# Patient Record
Sex: Male | Born: 1990 | Race: Black or African American | Hispanic: No | Marital: Single | Smoking: Current every day smoker
Health system: Southern US, Community
[De-identification: ages and names within clinical notes are randomized; demographics above are authoritative.]

## PROBLEM LIST (undated history)

## (undated) ENCOUNTER — Ambulatory Visit (HOSPITAL_COMMUNITY): Admission: EM

## (undated) DIAGNOSIS — B20 Human immunodeficiency virus [HIV] disease: Secondary | ICD-10-CM

---
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
## 2022-08-08 ENCOUNTER — Other Ambulatory Visit (HOSPITAL_COMMUNITY): Payer: Self-pay

## 2022-08-08 ENCOUNTER — Encounter (HOSPITAL_COMMUNITY): Payer: Self-pay

## 2022-08-08 ENCOUNTER — Emergency Department (HOSPITAL_COMMUNITY)
Admission: EM | Admit: 2022-08-08 | Discharge: 2022-08-08 | Disposition: A | Discharge status: Home / Self Care | Payer: 59 | Attending: Emergency Medicine | Admitting: Emergency Medicine

## 2022-08-08 DIAGNOSIS — R000 Tachycardia, unspecified: Secondary | ICD-10-CM | POA: Insufficient documentation

## 2022-08-08 DIAGNOSIS — Z21 Asymptomatic human immunodeficiency virus [HIV] infection status: Secondary | ICD-10-CM | POA: Diagnosis not present

## 2022-08-08 DIAGNOSIS — Z202 Contact with and (suspected) exposure to infections with a predominantly sexual mode of transmission: Secondary | ICD-10-CM | POA: Diagnosis present

## 2022-08-08 HISTORY — DX: Human immunodeficiency virus (HIV) disease: B20

## 2022-08-08 LAB — URINALYSIS, ROUTINE W REFLEX MICROSCOPIC
Glucose, UA: NEGATIVE mg/dL
Hgb urine dipstick: NEGATIVE
Ketones, ur: NEGATIVE mg/dL
Leukocytes,Ua: NEGATIVE
Nitrite: NEGATIVE
Protein, ur: 30 mg/dL — AB
Specific Gravity, Urine: 1.035 — ABNORMAL HIGH (ref 1.005–1.030)
pH: 5 (ref 5.0–8.0)

## 2022-08-08 MED ORDER — LIDOCAINE HCL (PF) 1 % IJ SOLN
INTRAMUSCULAR | Status: AC
  Administered 2022-08-08: 1.05 mL
  Filled 2022-08-08: qty 30

## 2022-08-08 MED ORDER — CEFTRIAXONE SODIUM 1 G IJ SOLR
500.0000 mg | Freq: Once | INTRAMUSCULAR | Status: AC
  Administered 2022-08-08: 500 mg via INTRAMUSCULAR
  Filled 2022-08-08: qty 10

## 2022-08-08 MED ORDER — DOXYCYCLINE HYCLATE 100 MG PO CAPS
100.0000 mg | ORAL_CAPSULE | Freq: Two times a day (BID) | ORAL | 0 refills | Status: AC
  Filled 2022-08-08: qty 20, 10d supply, fill #0

## 2022-08-08 NOTE — ED Triage Notes (Signed)
Pt presents with c/o possible exposure to STD. Pt reports unprotected sex approx 2 days ago. Pt reports he is now having some lower abdominal pressure as well as milky white discharge from his rectum and some blood from his rectum as well.

## 2022-08-08 NOTE — Discharge Instructions (Signed)
Please take antibiotic prescribed as treatment for suspected sexual transmitted infection.  Always use protection, follow-up on the test result through MyChart, link below.  Return if you have any concern.

## 2022-08-08 NOTE — ED Provider Notes (Signed)
[place] EMERGENCY DEPARTMENT AT [facility] Provider Note   CSN: [id number] Arrival date & time: 08/08/22  1035     History  No chief complaint on file.   Eric Dean is a 32 y.o. male.  The history is provided by the patient and medical records. No language interpreter was used.     32 year old male seen in history of HIV presenting with concerns for potential STD.  Patient report for the past 2 days he has noticed some rectal discomfort as well is having mucousy discharge coming from his rectum with some blood.  He endorsed feeling a bit fatigued, and just not feeling well.  Does not endorse any fever chills nausea vomiting diarrhea no dysuria.  He admits to having sex partners.  He is unsure of his last viral load but states that it was undetectable in April.  He is unsure of his CD4 count.  He does have an ID specialist and have been taking Biktarvy regularly.  He mentioned he has had similar rectal discomfort secondary to STI and would like to be tested.  Denies any penile rash, penile pain, or penile discharge.  Home Medications Prior to Admission medications   Not on File      Allergies    Patient has no allergy information on record.    Review of Systems   Review of Systems  All other systems reviewed and are negative.   Physical Exam Updated Vital Signs BP 125/83 (BP Location: Right Arm)   Pulse (!) 111   Temp 98.2 F (36.8 C) (Oral)   Resp 16   SpO2 99%  Physical Exam Vitals and nursing note reviewed.  Constitutional:      General: He is not in acute distress.    Appearance: He is well-developed.  HENT:     Head: Atraumatic.  Eyes:     Conjunctiva/sclera: Conjunctivae normal.  Cardiovascular:     Rate and Rhythm: Tachycardia present.     Pulses: Normal pulses.     Heart sounds: Normal heart sounds.  Pulmonary:     Effort: Pulmonary effort is normal.     Breath sounds: Normal breath sounds.  Abdominal:     Palpations: Abdomen is soft.      Tenderness: There is no abdominal tenderness.  Musculoskeletal:     Cervical back: Neck supple.  Skin:    Findings: No rash.  Neurological:     Mental Status: He is alert.     ED Results / Procedures / Treatments   Labs (all labs ordered are listed, but only abnormal results are displayed) Labs Reviewed  URINALYSIS, ROUTINE W REFLEX MICROSCOPIC - Abnormal; Notable for the following components:      Result Value   Color, Urine AMBER (*)    Specific Gravity, Urine 1.035 (*)    Bilirubin Urine SMALL (*)    Protein, ur 30 (*)    Bacteria, UA RARE (*)    All other components within normal limits  RPR  GC/CHLAMYDIA PROBE AMP ([place]) NOT AT [facility]    EKG None  Radiology No results found.  Procedures Procedures    Medications Ordered in ED Medications  cefTRIAXone (ROCEPHIN) injection 500 mg (500 mg Intramuscular Given 08/08/22 1139)  lidocaine (PF) (XYLOCAINE) 1 % injection (1.05 mLs  Given 08/08/22 1139)    ED Course/ Medical Decision Making/ A&P  Medical Decision Making  BP 125/83 (BP Location: Right Arm)   Pulse (!) 111   Temp 98.2 F (36.8 C) (Oral)   Resp 16   SpO2 99%   10:76 AM  32 year old male seen in history of HIV presenting with concerns for potential STD.  Patient report for the past 2 days he has noticed some rectal discomfort as well is having mucousy discharge coming from his rectum with some blood.  He endorsed feeling a bit fatigued, and just not feeling well.  Does not endorse any fever chills nausea vomiting diarrhea no dysuria.  He admits to having sex partners.  He is unsure of his last viral load but states that it was undetectable in April.  He is unsure of his CD4 count.  He does have an ID specialist and have been taking Biktarvy regularly.  He mentioned he has had similar rectal discomfort secondary to STI and would like to be tested.  On exam, well-appearing male resting comfortably in the chair appears  to be in no acute discomfort.  Heart with tachycardia, lungs are clear to auscultation bilaterally abdomen is soft nontender.  Chaperone was present during rectal exam.  Patient has normal rectal tone, nonthrombosed external hemorrhoid noted.  No perirectal mass, no anal abscess or perirectal abscess noted.  Normal color stool on glove no bleeding noted.  -Labs ordered, independently viewed and interpreted by me.  Labs remarkable for UA without evidence of UTI -The patient was maintained on a cardiac monitor.  I personally viewed and interpreted the cardiac monitored which showed an underlying rhythm of: sinus tachycardia -Imaging including pelvis CT considered but not performed as I have low suspicion for deep tissue infection or perforation of bowel. -This patient presents to the ED for concern of rectal discharge, this involves an extensive number of treatment options, and is a complaint that carries with it a high risk of complications and morbidity.  The differential diagnosis includes STD, rectal tear, perirectal abscess, anal fissure. -Co morbidities that complicate the patient evaluation includes HIV -Treatment includes rocephin -Reevaluation of the patient after these medicines showed that the patient stayed the same -PCP office notes or outside notes reviewed -Escalation to admission/observation considered: patients feels much better, is comfortable with discharge, and will follow up with PCP -Prescription medication considered, patient comfortable with doxycycline -Social Determinant of Health considered which includes tobacco use.         Final Clinical Impression(s) / ED Diagnoses Final diagnoses:  Possible exposure to STD    Rx / DC Orders ED Discharge Orders          Ordered    doxycycline (VIBRAMYCIN) 100 MG capsule  2 times daily        08/08/22 1207              Fayrene Helper, PA-C 08/08/22 1208    Linwood Dibbles, MD 08/09/22 [phone]

## 2022-08-09 LAB — RPR
RPR Ser Ql: REACTIVE — AB
RPR Titer: 1:8 {titer}

## 2022-08-09 LAB — GC/CHLAMYDIA PROBE AMP (~~LOC~~) NOT AT ARMC
Chlamydia: POSITIVE — AB
Comment: NEGATIVE
Comment: NORMAL
Neisseria Gonorrhea: NEGATIVE

## 2022-08-10 LAB — T.PALLIDUM AB, TOTAL: T Pallidum Abs: REACTIVE — AB

## 2022-08-16 ENCOUNTER — Other Ambulatory Visit (HOSPITAL_COMMUNITY): Payer: Self-pay
